# Patient Record
Sex: Female | Born: 1975 | Race: White | Hispanic: No | Marital: Married | Smoking: Current every day smoker
Health system: Southern US, Community
[De-identification: ages and names within clinical notes are randomized; demographics above are authoritative.]

## PROBLEM LIST (undated history)

## (undated) DIAGNOSIS — M797 Fibromyalgia: Secondary | ICD-10-CM

## (undated) DIAGNOSIS — E079 Disorder of thyroid, unspecified: Secondary | ICD-10-CM

## (undated) DIAGNOSIS — F319 Bipolar disorder, unspecified: Secondary | ICD-10-CM

## (undated) DIAGNOSIS — C801 Malignant (primary) neoplasm, unspecified: Secondary | ICD-10-CM

## (undated) HISTORY — PX: ABDOMINAL HYSTERECTOMY: SHX81

## (undated) HISTORY — DX: Malignant (primary) neoplasm, unspecified: C80.1

## (undated) HISTORY — DX: Disorder of thyroid, unspecified: E07.9

## (undated) HISTORY — DX: Fibromyalgia: M79.7

## (undated) HISTORY — DX: Bipolar disorder, unspecified: F31.9

## (undated) HISTORY — PX: BLADDER SURGERY: SHX569

---

## 2014-07-07 ENCOUNTER — Encounter (HOSPITAL_COMMUNITY)
Admission: EM | Disposition: A | Discharge status: Home / Self Care | Payer: Self-pay | Source: Home / Self Care | Attending: Emergency Medicine

## 2014-07-07 ENCOUNTER — Ambulatory Visit (HOSPITAL_COMMUNITY)
Admission: EM | Admit: 2014-07-07 | Discharge: 2014-07-07 | Disposition: A | Discharge status: Home / Self Care | Payer: Self-pay | Attending: Emergency Medicine | Admitting: Emergency Medicine

## 2014-07-07 ENCOUNTER — Emergency Department (HOSPITAL_COMMUNITY): Payer: Self-pay

## 2014-07-07 ENCOUNTER — Emergency Department (HOSPITAL_COMMUNITY): Payer: Medicaid - Out of State | Admitting: Anesthesiology

## 2014-07-07 ENCOUNTER — Encounter (HOSPITAL_COMMUNITY): Payer: Self-pay | Admitting: Emergency Medicine

## 2014-07-07 ENCOUNTER — Emergency Department (HOSPITAL_COMMUNITY): Payer: Self-pay | Admitting: Anesthesiology

## 2014-07-07 DIAGNOSIS — Z79899 Other long term (current) drug therapy: Secondary | ICD-10-CM | POA: Insufficient documentation

## 2014-07-07 DIAGNOSIS — S52572A Other intraarticular fracture of lower end of left radius, initial encounter for closed fracture: Secondary | ICD-10-CM | POA: Insufficient documentation

## 2014-07-07 DIAGNOSIS — Y999 Unspecified external cause status: Secondary | ICD-10-CM | POA: Insufficient documentation

## 2014-07-07 DIAGNOSIS — S62102A Fracture of unspecified carpal bone, left wrist, initial encounter for closed fracture: Secondary | ICD-10-CM

## 2014-07-07 DIAGNOSIS — W109XXA Fall (on) (from) unspecified stairs and steps, initial encounter: Secondary | ICD-10-CM | POA: Insufficient documentation

## 2014-07-07 DIAGNOSIS — Y929 Unspecified place or not applicable: Secondary | ICD-10-CM | POA: Insufficient documentation

## 2014-07-07 DIAGNOSIS — Y939 Activity, unspecified: Secondary | ICD-10-CM | POA: Insufficient documentation

## 2014-07-07 DIAGNOSIS — F17200 Nicotine dependence, unspecified, uncomplicated: Secondary | ICD-10-CM | POA: Insufficient documentation

## 2014-07-07 HISTORY — PX: OPEN REDUCTION INTERNAL FIXATION (ORIF) DISTAL RADIAL FRACTURE: SHX5989

## 2014-07-07 LAB — CBC WITH DIFFERENTIAL/PLATELET
BASOS ABS: 0 10*3/uL (ref 0.0–0.1)
BASOS PCT: 0 % (ref 0–1)
EOS ABS: 0.2 10*3/uL (ref 0.0–0.7)
EOS PCT: 2 % (ref 0–5)
HCT: 36.2 % (ref 36.0–46.0)
Hemoglobin: 11.6 g/dL — ABNORMAL LOW (ref 12.0–15.0)
LYMPHS PCT: 29 % (ref 12–46)
Lymphs Abs: 2.4 10*3/uL (ref 0.7–4.0)
MCH: 30.2 pg (ref 26.0–34.0)
MCHC: 32 g/dL (ref 30.0–36.0)
MCV: 94.3 fL (ref 78.0–100.0)
MONO ABS: 0.6 10*3/uL (ref 0.1–1.0)
Monocytes Relative: 7 % (ref 3–12)
NEUTROS PCT: 62 % (ref 43–77)
Neutro Abs: 5.1 10*3/uL (ref 1.7–7.7)
Platelets: 243 10*3/uL (ref 150–400)
RBC: 3.84 MIL/uL — AB (ref 3.87–5.11)
RDW: 12.7 % (ref 11.5–15.5)
WBC: 8.3 10*3/uL (ref 4.0–10.5)

## 2014-07-07 LAB — BASIC METABOLIC PANEL
ANION GAP: 5 (ref 5–15)
BUN: 9 mg/dL (ref 6–23)
CO2: 26 mmol/L (ref 19–32)
CREATININE: 0.64 mg/dL (ref 0.50–1.10)
Calcium: 9.1 mg/dL (ref 8.4–10.5)
Chloride: 106 mmol/L (ref 96–112)
GFR calc Af Amer: >90 mL/min (ref >90)
GFR calc non Af Amer: >90 mL/min (ref >90)
Glucose, Bld: 75 mg/dL (ref 70–99)
POTASSIUM: 4.4 mmol/L (ref 3.5–5.1)
Sodium: 137 mmol/L (ref 135–145)

## 2014-07-07 SURGERY — OPEN REDUCTION INTERNAL FIXATION (ORIF) DISTAL RADIUS FRACTURE
Anesthesia: General | Site: Wrist | Laterality: Left

## 2014-07-07 MED ORDER — DEXAMETHASONE SODIUM PHOSPHATE 4 MG/ML IJ SOLN
INTRAMUSCULAR | Status: AC
  Filled 2014-07-07: qty 1

## 2014-07-07 MED ORDER — OXYCODONE-ACETAMINOPHEN 5-325 MG PO TABS: 1.0000 | ORAL_TABLET | ORAL | Status: AC | PRN

## 2014-07-07 MED ORDER — SODIUM BICARBONATE 8.4 % IV SOLN
INTRAVENOUS | Status: AC
  Filled 2014-07-07: qty 50

## 2014-07-07 MED ORDER — OXYCODONE-ACETAMINOPHEN 5-325 MG PO TABS
ORAL_TABLET | ORAL | Status: AC
  Filled 2014-07-07: qty 1

## 2014-07-07 MED ORDER — PHENYLEPHRINE HCL 10 MG/ML IJ SOLN
INTRAMUSCULAR | Status: DC | PRN
  Administered 2014-07-07: 40 ug via INTRAVENOUS

## 2014-07-07 MED ORDER — BUPIVACAINE HCL (PF) 0.25 % IJ SOLN
INTRAMUSCULAR | Status: AC
  Filled 2014-07-07: qty 30

## 2014-07-07 MED ORDER — PROPOFOL 10 MG/ML IV BOLUS
INTRAVENOUS | Status: DC | PRN
  Administered 2014-07-07: 160 mg via INTRAVENOUS

## 2014-07-07 MED ORDER — ONDANSETRON HCL 4 MG/2ML IJ SOLN
4.0000 mg | Freq: Once | INTRAMUSCULAR | Status: AC
  Administered 2014-07-07: 4 mg via INTRAVENOUS
  Filled 2014-07-07: qty 2

## 2014-07-07 MED ORDER — MIDAZOLAM HCL 2 MG/2ML IJ SOLN
INTRAMUSCULAR | Status: AC
  Administered 2014-07-07: 2 mg via INTRAVENOUS
  Filled 2014-07-07: qty 2

## 2014-07-07 MED ORDER — OXYCODONE HCL 5 MG PO TABS: 5.0000 mg | ORAL_TABLET | Freq: Once | ORAL | Status: DC | PRN

## 2014-07-07 MED ORDER — HYDROMORPHONE HCL 1 MG/ML IJ SOLN: 0.2500 mg | INTRAMUSCULAR | Status: DC | PRN

## 2014-07-07 MED ORDER — FENTANYL CITRATE 0.05 MG/ML IJ SOLN
50.0000 ug | Freq: Once | INTRAMUSCULAR | Status: AC
  Administered 2014-07-07: 50 ug via INTRAVENOUS
  Filled 2014-07-07: qty 2

## 2014-07-07 MED ORDER — FENTANYL CITRATE 0.05 MG/ML IJ SOLN
INTRAMUSCULAR | Status: AC
  Filled 2014-07-07: qty 5

## 2014-07-07 MED ORDER — ROCURONIUM BROMIDE 50 MG/5ML IV SOLN
INTRAVENOUS | Status: AC
  Filled 2014-07-07: qty 1

## 2014-07-07 MED ORDER — SODIUM CHLORIDE 0.9 % IJ SOLN
INTRAMUSCULAR | Status: AC
  Filled 2014-07-07: qty 10

## 2014-07-07 MED ORDER — STERILE WATER FOR INJECTION IJ SOLN
INTRAMUSCULAR | Status: AC
  Filled 2014-07-07: qty 10

## 2014-07-07 MED ORDER — OXYCODONE HCL 5 MG/5ML PO SOLN: 5.0000 mg | Freq: Once | ORAL | Status: DC | PRN

## 2014-07-07 MED ORDER — PROPOFOL 10 MG/ML IV BOLUS
INTRAVENOUS | Status: AC
  Filled 2014-07-07: qty 20

## 2014-07-07 MED ORDER — SUCCINYLCHOLINE CHLORIDE 20 MG/ML IJ SOLN
INTRAMUSCULAR | Status: AC
  Filled 2014-07-07: qty 1

## 2014-07-07 MED ORDER — LIDOCAINE HCL (CARDIAC) 20 MG/ML IV SOLN
INTRAVENOUS | Status: AC
Start: 2014-07-07 — End: 2014-07-07
  Filled 2014-07-07: qty 5

## 2014-07-07 MED ORDER — PROMETHAZINE HCL 25 MG/ML IJ SOLN: 6.2500 mg | INTRAMUSCULAR | Status: DC | PRN

## 2014-07-07 MED ORDER — LACTATED RINGERS IV SOLN
INTRAVENOUS | Status: DC | PRN
  Administered 2014-07-07: 17:00:00 via INTRAVENOUS

## 2014-07-07 MED ORDER — FENTANYL CITRATE 0.05 MG/ML IJ SOLN
INTRAMUSCULAR | Status: DC | PRN
  Administered 2014-07-07 (×2): 50 ug via INTRAVENOUS

## 2014-07-07 MED ORDER — LIDOCAINE HCL (CARDIAC) 20 MG/ML IV SOLN
INTRAVENOUS | Status: AC
  Filled 2014-07-07: qty 5

## 2014-07-07 MED ORDER — OXYCODONE-ACETAMINOPHEN 5-325 MG PO TABS
1.0000 | ORAL_TABLET | Freq: Once | ORAL | Status: AC
  Administered 2014-07-07: 1 via ORAL

## 2014-07-07 MED ORDER — ONDANSETRON HCL 4 MG/2ML IJ SOLN
INTRAMUSCULAR | Status: DC | PRN
  Administered 2014-07-07: 4 mg via INTRAVENOUS

## 2014-07-07 MED ORDER — LACTATED RINGERS IV SOLN
INTRAVENOUS | Status: DC
  Administered 2014-07-07: 15:00:00 via INTRAVENOUS

## 2014-07-07 MED ORDER — ARTIFICIAL TEARS OP OINT
TOPICAL_OINTMENT | OPHTHALMIC | Status: AC
  Filled 2014-07-07: qty 3.5

## 2014-07-07 MED ORDER — MIDAZOLAM HCL 2 MG/2ML IJ SOLN
INTRAMUSCULAR | Status: AC
  Filled 2014-07-07: qty 2

## 2014-07-07 MED ORDER — CEFAZOLIN SODIUM-DEXTROSE 2-3 GM-% IV SOLR
INTRAVENOUS | Status: DC | PRN
  Administered 2014-07-07: 2 g via INTRAVENOUS

## 2014-07-07 MED ORDER — LIDOCAINE HCL (CARDIAC) 20 MG/ML IV SOLN
INTRAVENOUS | Status: DC | PRN
  Administered 2014-07-07: 80 mg via INTRAVENOUS

## 2014-07-07 MED ORDER — FENTANYL CITRATE 0.05 MG/ML IJ SOLN
INTRAMUSCULAR | Status: AC
  Filled 2014-07-07: qty 2

## 2014-07-07 MED ORDER — FENTANYL CITRATE 0.05 MG/ML IJ SOLN
100.0000 ug | Freq: Once | INTRAMUSCULAR | Status: AC
Start: 2014-07-07 — End: 2014-07-07
  Administered 2014-07-07: 100 ug via INTRAVENOUS

## 2014-07-07 MED ORDER — FENTANYL CITRATE 0.05 MG/ML IJ SOLN: 50.0000 ug | INTRAMUSCULAR | Status: DC | PRN

## 2014-07-07 MED ORDER — EPHEDRINE SULFATE 50 MG/ML IJ SOLN
INTRAMUSCULAR | Status: AC
  Filled 2014-07-07: qty 1

## 2014-07-07 MED ORDER — ONDANSETRON HCL 4 MG/2ML IJ SOLN
INTRAMUSCULAR | Status: AC
  Filled 2014-07-07: qty 2

## 2014-07-07 MED ORDER — MIDAZOLAM HCL 2 MG/2ML IJ SOLN
2.0000 mg | Freq: Once | INTRAMUSCULAR | Status: AC
  Administered 2014-07-07: 2 mg via INTRAVENOUS

## 2014-07-07 MED ORDER — BUPIVACAINE-EPINEPHRINE (PF) 0.5% -1:200000 IJ SOLN
INTRAMUSCULAR | Status: DC | PRN
  Administered 2014-07-07: 20 mL via PERINEURAL

## 2014-07-07 MED ORDER — DEXAMETHASONE SODIUM PHOSPHATE 4 MG/ML IJ SOLN
INTRAMUSCULAR | Status: DC | PRN
  Administered 2014-07-07: 4 mg via INTRAVENOUS

## 2014-07-07 MED ORDER — CEFAZOLIN SODIUM-DEXTROSE 2-3 GM-% IV SOLR
INTRAVENOUS | Status: AC
  Filled 2014-07-07: qty 50

## 2014-07-07 SURGICAL SUPPLY — 63 items
BANDAGE ELASTIC 3 VELCRO ST LF (GAUZE/BANDAGES/DRESSINGS) ×3 IMPLANT
BANDAGE ELASTIC 4 VELCRO ST LF (GAUZE/BANDAGES/DRESSINGS) ×3 IMPLANT
BIT DRILL 2 FAST STEP (BIT) ×3 IMPLANT
BIT DRILL 2.5X4 QC (BIT) ×3 IMPLANT
BNDG CMPR 9X4 STRL LF SNTH (GAUZE/BANDAGES/DRESSINGS) ×1
BNDG ESMARK 4X9 LF (GAUZE/BANDAGES/DRESSINGS) ×3 IMPLANT
BNDG GAUZE ELAST 4 BULKY (GAUZE/BANDAGES/DRESSINGS) ×3 IMPLANT
CLOSURE WOUND 1/2 X4 (GAUZE/BANDAGES/DRESSINGS) ×1
CORDS BIPOLAR (ELECTRODE) IMPLANT
COVER SURGICAL LIGHT HANDLE (MISCELLANEOUS) ×3 IMPLANT
CUFF TOURNIQUET SINGLE 18IN (TOURNIQUET CUFF) ×3 IMPLANT
CUFF TOURNIQUET SINGLE 24IN (TOURNIQUET CUFF) IMPLANT
DRAPE C-ARM MINI 42X72 WSTRAPS (DRAPES) ×3 IMPLANT
DRAPE OEC MINIVIEW 54X84 (DRAPES) IMPLANT
DRAPE SURG 17X23 STRL (DRAPES) ×3 IMPLANT
DURAPREP 26ML APPLICATOR (WOUND CARE) ×3 IMPLANT
ELECT REM PT RETURN 9FT ADLT (ELECTROSURGICAL)
ELECTRODE REM PT RTRN 9FT ADLT (ELECTROSURGICAL) IMPLANT
GAUZE SPONGE 4X4 12PLY STRL (GAUZE/BANDAGES/DRESSINGS) ×3 IMPLANT
GAUZE XEROFORM 1X8 LF (GAUZE/BANDAGES/DRESSINGS) ×3 IMPLANT
GLOVE SURG SYN 8.0 (GLOVE) ×3 IMPLANT
GOWN STRL REUS W/ TWL LRG LVL3 (GOWN DISPOSABLE) ×1 IMPLANT
GOWN STRL REUS W/ TWL XL LVL3 (GOWN DISPOSABLE) ×1 IMPLANT
GOWN STRL REUS W/TWL LRG LVL3 (GOWN DISPOSABLE) ×3
GOWN STRL REUS W/TWL XL LVL3 (GOWN DISPOSABLE) ×3
KIT BASIN OR (CUSTOM PROCEDURE TRAY) ×3 IMPLANT
KIT ROOM TURNOVER OR (KITS) ×3 IMPLANT
MANIFOLD NEPTUNE II (INSTRUMENTS) ×3 IMPLANT
NEEDLE HYPO 25GX1X1/2 BEV (NEEDLE) IMPLANT
NS IRRIG 1000ML POUR BTL (IV SOLUTION) ×3 IMPLANT
PACK ORTHO EXTREMITY (CUSTOM PROCEDURE TRAY) ×3 IMPLANT
PAD ARMBOARD 7.5X6 YLW CONV (MISCELLANEOUS) ×6 IMPLANT
PAD CAST 3X4 CTTN HI CHSV (CAST SUPPLIES) ×1 IMPLANT
PAD CAST 4YDX4 CTTN HI CHSV (CAST SUPPLIES) ×1 IMPLANT
PADDING CAST ABS 4INX4YD NS (CAST SUPPLIES) ×2
PADDING CAST ABS COTTON 4X4 ST (CAST SUPPLIES) ×1 IMPLANT
PADDING CAST COTTON 3X4 STRL (CAST SUPPLIES) ×3
PADDING CAST COTTON 4X4 STRL (CAST SUPPLIES) ×3
PEG SUBCHONDRAL SMOOTH 2.0X20 (Peg) ×12 IMPLANT
PEG SUBCHONDRAL SMOOTH 2.0X22 (Peg) ×9 IMPLANT
PENCIL BUTTON HOLSTER BLD 10FT (ELECTRODE) IMPLANT
PLATE STAN 24.4X59.5 LT (Plate) ×3 IMPLANT
SCREW BN 12X3.5XNS CORT TI (Screw) ×1 IMPLANT
SCREW BN 13X3.5XNS CORT TI (Screw) ×1 IMPLANT
SCREW CORT 3.5X10 LNG (Screw) ×3 IMPLANT
SCREW CORT 3.5X12 (Screw) ×3 IMPLANT
SCREW CORT 3.5X13 (Screw) ×3 IMPLANT
SPLINT PLASTER EXTRA FAST 3X15 (CAST SUPPLIES) ×2
SPLINT PLASTER GYPS XFAST 3X15 (CAST SUPPLIES) ×1 IMPLANT
SPONGE LAP 4X18 X RAY DECT (DISPOSABLE) IMPLANT
SPONGE SCRUB IODOPHOR (GAUZE/BANDAGES/DRESSINGS) ×3 IMPLANT
STRIP CLOSURE SKIN 1/2X4 (GAUZE/BANDAGES/DRESSINGS) ×2 IMPLANT
SUT PROLENE 3 0 PS 2 (SUTURE) ×3 IMPLANT
SUT VIC AB 2-0 FS1 27 (SUTURE) ×3 IMPLANT
SUT VIC AB 3-0 FS2 27 (SUTURE) IMPLANT
SUT VICRYL 4-0 PS2 18IN ABS (SUTURE) IMPLANT
SYR CONTROL 10ML LL (SYRINGE) IMPLANT
TOWEL OR 17X24 6PK STRL BLUE (TOWEL DISPOSABLE) ×3 IMPLANT
TOWEL OR 17X26 10 PK STRL BLUE (TOWEL DISPOSABLE) ×3 IMPLANT
TUBE CONNECTING 12'X1/4 (SUCTIONS)
TUBE CONNECTING 12X1/4 (SUCTIONS) IMPLANT
UNDERPAD 30X30 INCONTINENT (UNDERPADS AND DIAPERS) ×3 IMPLANT
WATER STERILE IRR 1000ML POUR (IV SOLUTION) ×3 IMPLANT

## 2014-07-07 NOTE — Transfer of Care (Signed)
Immediate Anesthesia Transfer of Care Note  Patient: Jennifer Michael  Procedure(s) Performed: Procedure(s): OPEN REDUCTION INTERNAL FIXATION (ORIF) DISTAL RADIAL FRACTURE (Left)  Patient Location: PACU  Anesthesia Type:GA combined with regional for post-op pain  Level of Consciousness: awake  Airway & Oxygen Therapy: Patient Spontanous Breathing  Post-op Assessment: Report given to RN and Post -op Vital signs reviewed and stable  Post vital signs: Reviewed and stable  Last Vitals:  Filed Vitals:   07/07/14 1317  BP: 108/67  Pulse: 73  Temp: 36.7 C  Resp: 18    Complications: No apparent anesthesia complications

## 2014-07-07 NOTE — ED Notes (Signed)
Bed: WA20 Expected date:  Expected time:  Means of arrival:  Comments: 

## 2014-07-07 NOTE — H&P (Signed)
Jennifer Michael is an 39 y.o. female.   Chief Complaint: left wrist pain and deformity HPI: as above s/p [place] with left distal radius fracture  History reviewed. No pertinent past medical history.  Past Surgical History  Procedure Laterality Date  . Bladder surgery      History reviewed. No pertinent family history. Social History:  reports that she has been smoking.  She does not have any smokeless tobacco history on file. She reports that she does not drink alcohol or use illicit drugs.  Allergies: No Known Allergies  Medications Prior to Admission  Medication Sig Dispense Refill  . venlafaxine (EFFEXOR) 50 MG tablet Take 50 mg by mouth at bedtime.      Results for orders placed or performed during the hospital encounter of 07/07/14 (from the past 48 hour(s))  CBC with Differential     Status: Abnormal   Collection Time: 07/07/14 12:23 PM  Result Value Ref Range   WBC 8.3 4.0 - 10.5 K/uL   RBC 3.84 (L) 3.87 - 5.11 MIL/uL   Hemoglobin 11.6 (L) 12.0 - 15.0 g/dL   HCT 36.2 36.0 - 46.0 %   MCV 94.3 78.0 - 100.0 fL   MCH 30.2 26.0 - 34.0 pg   MCHC 32.0 30.0 - 36.0 g/dL   RDW 12.7 11.5 - 15.5 %   Platelets 243 150 - 400 K/uL   Neutrophils Relative % 62 43 - 77 %   Neutro Abs 5.1 1.7 - 7.7 K/uL   Lymphocytes Relative 29 12 - 46 %   Lymphs Abs 2.4 0.7 - 4.0 K/uL   Monocytes Relative 7 3 - 12 %   Monocytes Absolute 0.6 0.1 - 1.0 K/uL   Eosinophils Relative 2 0 - 5 %   Eosinophils Absolute 0.2 0.0 - 0.7 K/uL   Basophils Relative 0 0 - 1 %   Basophils Absolute 0.0 0.0 - 0.1 K/uL  Basic metabolic panel     Status: None   Collection Time: 07/07/14 12:23 PM  Result Value Ref Range   Sodium 137 135 - 145 mmol/L   Potassium 4.4 3.5 - 5.1 mmol/L   Chloride 106 96 - 112 mmol/L   CO2 26 19 - 32 mmol/L   Glucose, Bld 75 70 - 99 mg/dL   BUN 9 6 - 23 mg/dL   Creatinine, Ser 0.64 0.50 - 1.10 mg/dL   Calcium 9.1 8.4 - 10.5 mg/dL   GFR calc non Af Amer >90 >90 mL/min   GFR calc Af  Amer >90 >90 mL/min    Comment: (NOTE) The eGFR has been calculated using the CKD EPI equation. This calculation has not been validated in all clinical situations. eGFR's persistently <90 mL/min signify possible Chronic Kidney Disease.    Anion gap 5 5 - 15    Comment: Performed at [facility]   Dg Wrist Complete Left  07/07/2014   CLINICAL DATA:  Acute left wrist pain after fall down steps at motel. Initial encounter.  EXAM: LEFT WRIST - COMPLETE 3+ VIEW  COMPARISON:  None.  FINDINGS: Severely and posteriorly displaced comminuted fracture of the distal left radius is noted. This appears to be closed and posttraumatic.  IMPRESSION: Severely displaced comminuted fracture of the distal left radius.   Electronically Signed   By: Marijo Conception, M.D.   On: 07/07/2014 11:38    Review of Systems  All other systems reviewed and are negative.   Blood pressure 108/67, pulse 73, temperature 98.1 F (36.7 C),  temperature source Oral, resp. rate 18, SpO2 98 %. Physical Exam  Constitutional: She is oriented to person, place, and time. She appears well-developed and well-nourished.  HENT:  Head: Normocephalic and atraumatic.  Cardiovascular: Normal rate.   Respiratory: Effort normal.  Musculoskeletal:       Left wrist: She exhibits bony tenderness, swelling and deformity.  Displaced left distal radius fracture  Neurological: She is alert and oriented to person, place, and time.  Skin: Skin is warm.  Psychiatric: She has a normal mood and affect. Her behavior is normal. Judgment and thought content normal.     Assessment/Plan As above   Plan ORIF  Urijah Arko A 07/07/2014, 3:59 PM

## 2014-07-07 NOTE — ED Provider Notes (Signed)
Complains of left wrist pain after she slipped on wet floor falling down steps 30 minute prior to coming here. No other injury. On exam patient is alert Glasgow Coma Score 15 left upper extremity skin intact radial pulse 2+. Deformity at and tenderness wrist. Good capillary refill X-rays viewed by me  Orlie Dakin, MD 07/07/14 1208

## 2014-07-07 NOTE — ED Notes (Signed)
Pt states that she is staying at the [facility] [address].  States that she was going down the stairs and did not see a wet floor sign.  Stairs had just been mopped.  States that she slipped and fell down about 8 stairs.  Landed 3 stairs from the bottom.  Obvious deformity noted to lt wrist.  Denies other injury.

## 2014-07-07 NOTE — Op Note (Signed)
See note [phone]

## 2014-07-07 NOTE — Progress Notes (Addendum)
Patient reports that she is itching all over d/t eczema and that she has a rash on both legs from scratching same. Reports that the itching is not new since being in the hospital. Request something for pain and itching Will notify Dr. Massage of same. Dr. Orene Desanctis notified orders received for pain medication

## 2014-07-07 NOTE — ED Notes (Signed)
Report called to [facility] in [facility] Short Stay. Instructions and relevant contact information will be relayed to pt. IV secured in [place].

## 2014-07-07 NOTE — Op Note (Signed)
[name], Jennifer Michael             ACCOUNT NO.:  [id number]  MEDICAL RECORD NO.:  [id number]  LOCATION:  [facility]                         FACILITY:  [facility]  PHYSICIAN:  Sheral Apley. Adarrius Graeff, M.D.DATE OF BIRTH:  Dec 26, 1975  DATE OF PROCEDURE:  07/07/2014 DATE OF DISCHARGE:  07/07/2014                              OPERATIVE REPORT   PREOPERATIVE DIAGNOSIS:  Displaced intra-articular fracture distal radius, left side.  POSTOPERATIVE DIAGNOSIS:  Displaced intra-articular fracture distal radius, left side.  PROCEDURE:  ORIF of above with DVR plate and screws, brachioradialis release.  SURGEON:  Sheral Apley. Burney Gauze, M.D.  ASSISTANT:  None.  ANESTHESIA:  General with supraclavicular block.  COMPLICATION:  None.  DRAINS:  None.  OPERATIVE DESCRIPTION:  The patient was taken operating suite.  After induction of adequate supraclavicular block analgesia and general laryngeal mask airway general anesthetic, left upper extremity was prepped and draped in sterile fashion.  An Esmarch was used to exsanguinate the limb.  Tourniquet was inflated to 250 mmHg.  At this point in time, an incision was made over the palpable border of flexor carpi radialis tendon along the volar aspect of the left distal radius area.  Skin was incised sharply.  The sheath overlying the FCR was incised.  The FCR was tracked to the midline, radial artery to lateral side.  Fascia was incised and developed down to the level of pronator quadratus.  We subperiosteally stripped the pronator quadratus, this showed displaced three-part intra-articular fracture distal radius, left side.  We released brachioradialis off the radial styloid fragment. Reduction was then performed with flexion, ulnar deviation, and traction.  We placed a standard DVR plate on the lower aspect of distal radius.  Under direct fluoroscopic guidance, determined appropriate plate position.  We put a screw in the slotted hole followed by 2 more screws  proximally followed by smooth pegs distally.  Intraoperative fluoroscopy revealed adequate reduction in AP, lateral, oblique view. The wound was thoroughly irrigated.  The pronator quadratus was repaired using 2-0 Vicryl to cover the plate almost completely, 2-0 Vicryl subcutaneously, and 3-0 Prolene subcuticular stitch on the skin. Steri-Strips, 4x4s, fluffs, and a volar splint was applied.  Patient tolerated the procedure well in concealed fashion.     Sheral Apley Burney Gauze, M.D.     MAW/MEDQ  D:  07/07/2014  T:  07/07/2014  Job:  [id number]

## 2014-07-07 NOTE — Anesthesia Preprocedure Evaluation (Addendum)
Anesthesia Evaluation  Patient identified by MRN, date of birth, ID band Patient awake    Reviewed: Allergy & Precautions, NPO status , Patient's Chart, lab work & pertinent test results  Airway Mallampati: I  TM Distance: >3 FB Neck ROM: Full    Dental  (+) Poor Dentition, Dental Advisory Given   Pulmonary Current Smoker,  breath sounds clear to auscultation        Cardiovascular Rhythm:Regular Rate:Normal     Neuro/Psych    GI/Hepatic   Endo/Other    Renal/GU      Musculoskeletal   Abdominal   Peds  Hematology   Anesthesia Other Findings   Reproductive/Obstetrics                            Anesthesia Physical Anesthesia Plan  ASA: II and emergent  Anesthesia Plan:    Post-op Pain Management:    Induction: Intravenous  Airway Management Planned: LMA  Additional Equipment:   Intra-op Plan:   Post-operative Plan: Extubation in OR  Informed Consent: I have reviewed the patients History and Physical, chart, labs and discussed the procedure including the risks, benefits and alternatives for the proposed anesthesia with the patient or authorized representative who has indicated his/her understanding and acceptance.   Dental advisory given  Plan Discussed with: CRNA, Surgeon and Anesthesiologist  Anesthesia Plan Comments:        Anesthesia Quick Evaluation

## 2014-07-07 NOTE — Anesthesia Procedure Notes (Addendum)
Anesthesia Regional Block:  Supraclavicular block  Pre-Anesthetic Checklist: ,, timeout performed, Correct Patient, Correct Site, Correct Laterality, Correct Procedure, Correct Position, site marked, Risks and benefits discussed,  Surgical consent,  Pre-op evaluation,  At surgeon's request and post-op pain management  Laterality: Upper  Prep: chloraprep       Needles:   Needle Type: Echogenic Needle     Needle Length: 9cm 9 cm Needle Gauge: 21 and 21 G  Needle insertion depth: 4 cm   Additional Needles:  Procedures: ultrasound guided (picture in chart) and nerve stimulator Supraclavicular block Narrative:  Start time: 07/07/2014 3:55 PM End time: 07/07/2014 4:10 PM Injection made incrementally with aspirations every 5 mL.  Performed by: Personally  Anesthesiologist: MASSAGEE, TERRY  Additional Notes: Tolerated well   Procedure Name: LMA Insertion Date/Time: 07/07/2014 4:43 PM Performed by: Maude Leriche D Pre-anesthesia Checklist: Patient identified, Emergency Drugs available, Suction available, Patient being monitored and Timeout performed Patient Re-evaluated:Patient Re-evaluated prior to inductionOxygen Delivery Method: Circle system utilized Preoxygenation: Pre-oxygenation with 100% oxygen Intubation Type: IV induction Ventilation: Mask ventilation without difficulty LMA: LMA inserted LMA Size: 4.0 Number of attempts: 1 Placement Confirmation: positive ETCO2 and breath sounds checked- equal and bilateral Tube secured with: Tape Dental Injury: Teeth and Oropharynx as per pre-operative assessment

## 2014-07-07 NOTE — ED Provider Notes (Signed)
CSN: [id number]     Arrival date & time 07/07/14  1039 History   First MD Initiated Contact with Patient 07/07/14 1100     Chief Complaint  Patient presents with  . Fall  . Wrist Injury     (Consider location/radiation/quality/duration/timing/severity/associated sxs/prior Treatment) HPI Jennifer Michael is Jennifer 39 y.o. female presents to ED with complaint of left wrist injury. Pt states she slipped on wet floor and fell down onto left hand. States she then slipped onto her bottom and slipped down Jennifer few steps. She states she was able to get up and ambulate. There is no other injuries. She denies hitting her head or having loss of consciousness. No treatment prior to coming in. Deformity to the left wrist with pain. No numbness or weakness distally. No prior orthopedic injuries.  History reviewed. No pertinent past medical history. Past Surgical History  Procedure Laterality Date  . Bladder surgery     History reviewed. No pertinent family history. History  Substance Use Topics  . Smoking status: Current Every Day Smoker  . Smokeless tobacco: Not on file  . Alcohol Use: No   OB History    No data available     Review of Systems  Musculoskeletal: Positive for joint swelling and arthralgias. Negative for back pain and neck pain.  Neurological: Negative for weakness, numbness and headaches.  All other systems reviewed and are negative.     Allergies  Review of patient's allergies indicates no known allergies.  Home Medications   Prior to Admission medications   Not on File   BP 106/72 mmHg  Pulse 96  Temp(Src) 98.9 F (37.2 C) (Oral)  Resp 18  SpO2 100% Physical Exam  Constitutional: She appears well-developed and well-nourished. No distress.  HENT:  Head: Normocephalic.  Eyes: Conjunctivae are normal.  Neck: Neck supple.  Cardiovascular: Normal rate, regular rhythm and normal heart sounds.   Pulmonary/Chest: Effort normal and breath sounds normal. No respiratory  distress. She has no wheezes. She has no rales.  Abdominal: Soft. Bowel sounds are normal. She exhibits no distension. There is no tenderness. There is no rebound.  Musculoskeletal: She exhibits no edema.  Swelling and deformity of the left wrist. Tender to palpation diffusely over the wrist joint. Unable to move at the wrist joint. Unable to extend the thumb. Patient is able to wiggle all the rest of her fingers. Cap refill less than 2 seconds distally in all fingers. Sensation intact to her palmar and dorsal surface of the hand. Hand is warm and pink. Distal radial pulse intact.  Neurological: She is alert.  Skin: Skin is warm and dry.  Psychiatric: She has Jennifer normal mood and affect. Her behavior is normal.  Nursing note and vitals reviewed.   ED Course  Procedures (including critical care time) Labs Review Labs Reviewed - No data to display  Imaging Review No results found.   EKG Interpretation None      MDM   Final diagnoses:  Left wrist fracture, closed, initial encounter   Pt with comminuted and displaced fracture of left wrist. Discussed with Dr. Burney Gauze, will tramsfer to [facility] for surgical repair. Labs already collected by RN. Pain controlled with fentanyl. Sugar tong splint applied prior to transfer. Pt is neurovascularly intact. Agrees to the plan.   Filed Vitals:   07/07/14 1052  BP: 106/72  Pulse: 96  Temp: 98.9 F (37.2 C)  TempSrc: Oral  Resp: 18  SpO2: 100%       Jennifer Michael  Jennifer Aidah Forquer, PA-C 07/08/14 [place], MD 07/09/14 1026

## 2014-07-09 ENCOUNTER — Encounter (HOSPITAL_COMMUNITY): Payer: Self-pay | Admitting: Orthopedic Surgery

## 2014-07-10 NOTE — Anesthesia Postprocedure Evaluation (Signed)
  Anesthesia Post-op Note  Patient: Jennifer Michael  Procedure(s) Performed: Procedure(s): OPEN REDUCTION INTERNAL FIXATION (ORIF) DISTAL RADIAL FRACTURE (Left)  Patient Location: PACU  Anesthesia Type:General  Level of Consciousness: awake and alert   Airway and Oxygen Therapy: Patient Spontanous Breathing  Post-op Pain: mild  Post-op Assessment: Post-op Vital signs reviewed  Post-op Vital Signs: stable  Last Vitals:  Filed Vitals:   07/07/14 1858  BP:   Pulse:   Temp: 36.4 C  Resp:     Complications: No apparent anesthesia complications

## 2016-03-25 IMAGING — CR DG WRIST COMPLETE 3+V*L*
4 series · 4 of 4 positions shown · non-contrast
Comparison: None.

CLINICAL DATA: Acute left wrist pain after fall down steps at
motel. Initial encounter.

EXAM:
LEFT WRIST - COMPLETE 3+ VIEW

[x wrist obl left]
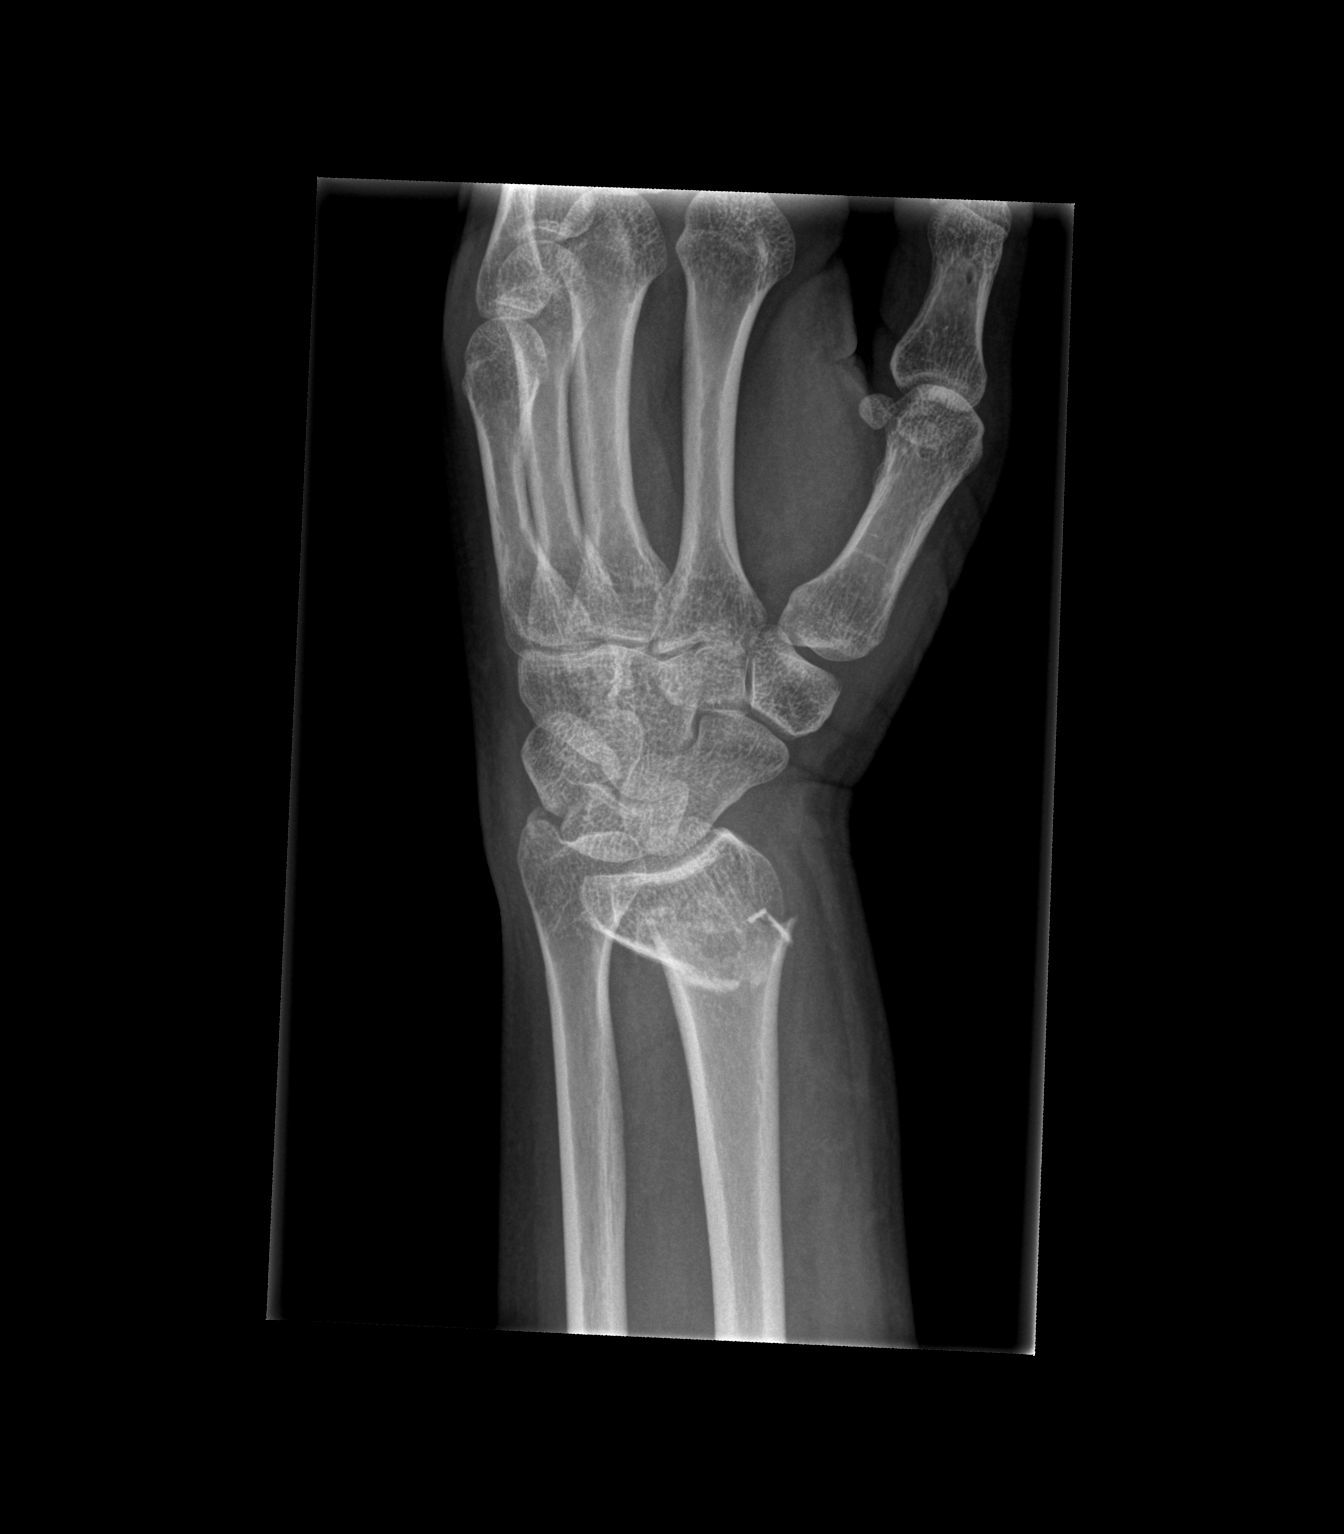

[x wrist lat left]
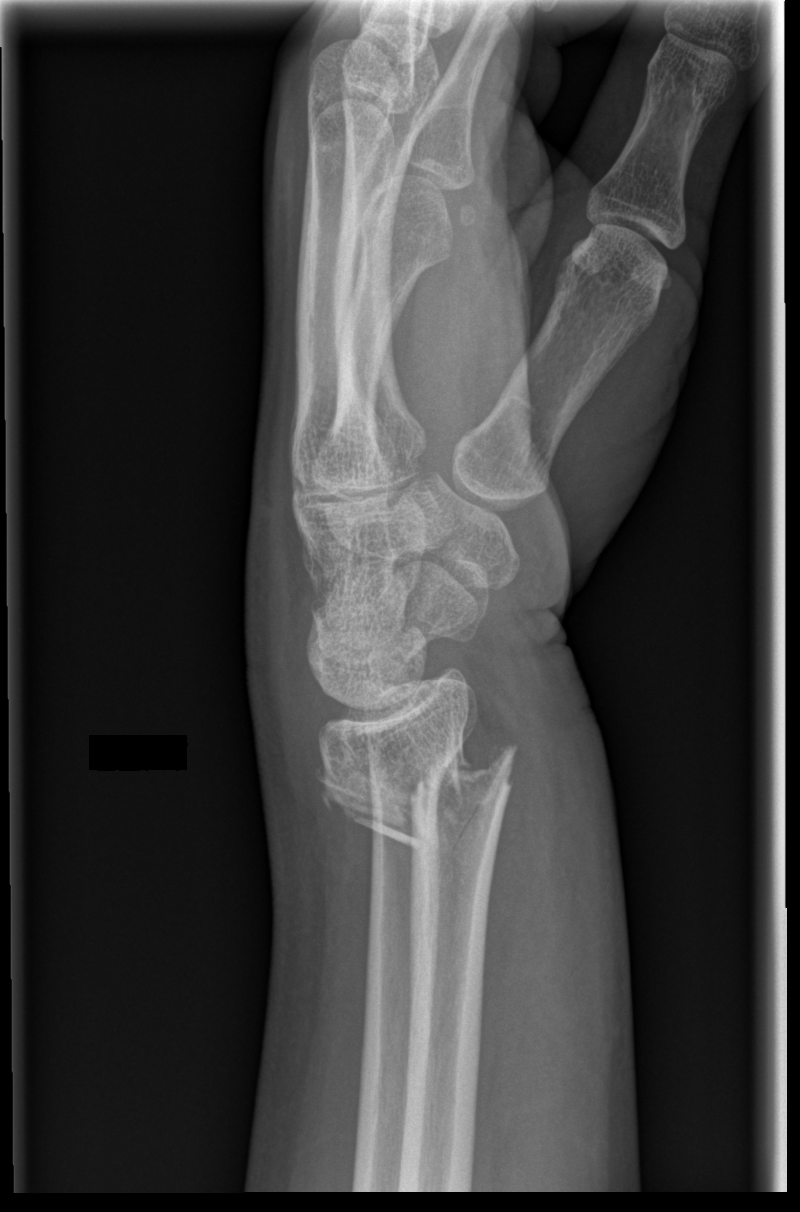

[x wrist pa left]
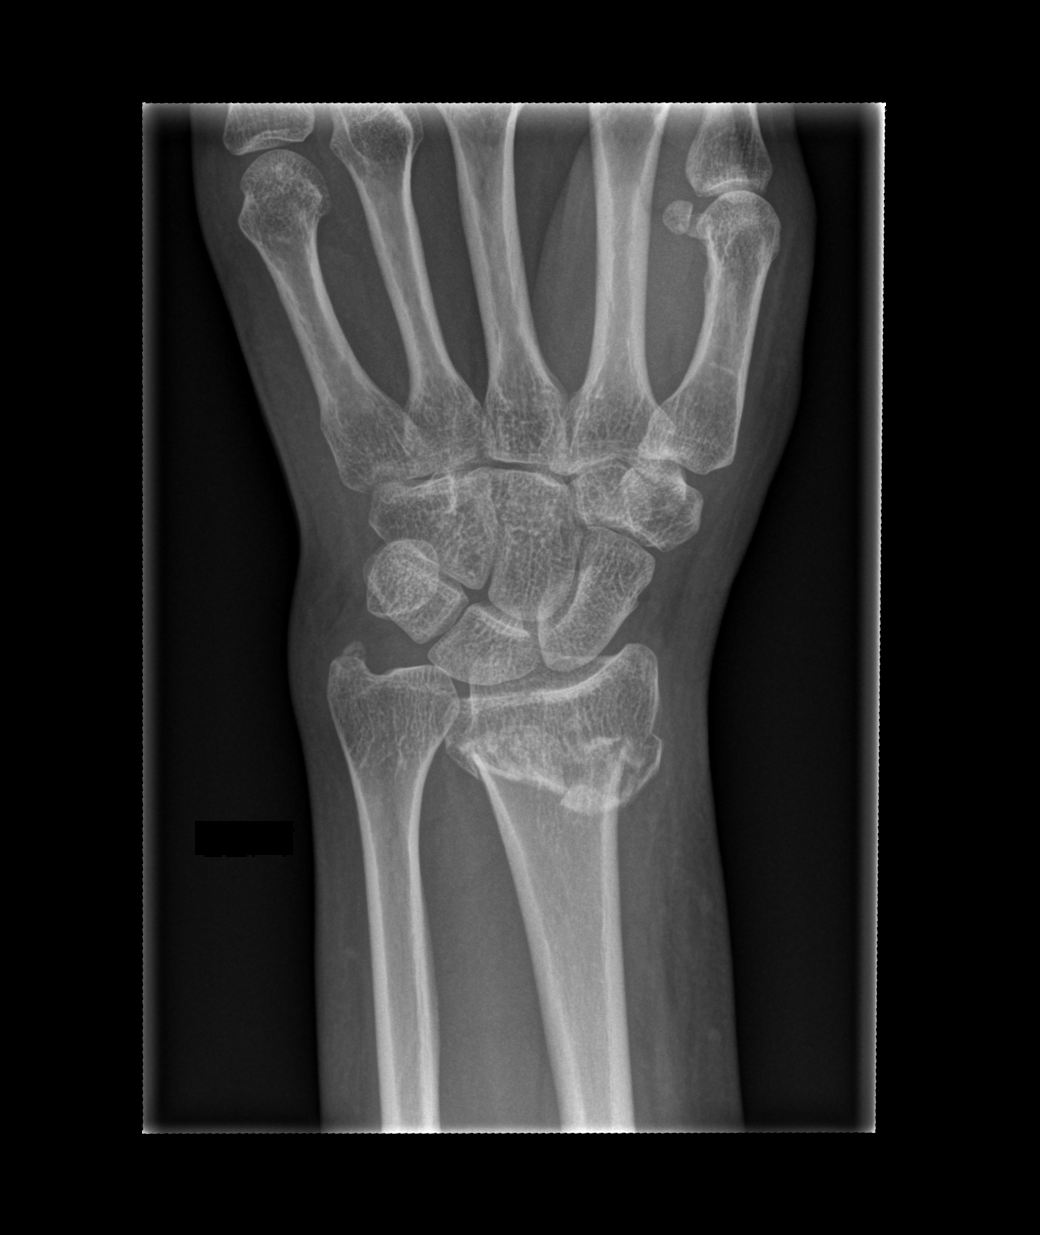

[x wrist navicular view left]
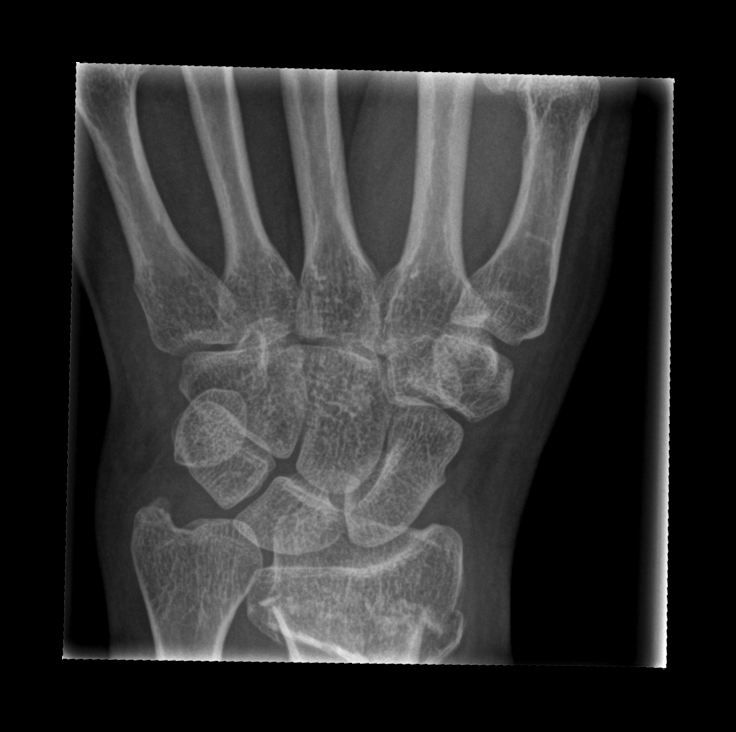

[4 of 4 positions shown; findings below may reference images not displayed]

FINDINGS: Severely and posteriorly displaced comminuted fracture of the distal
left radius is noted. This appears to be closed and posttraumatic.
IMPRESSION: Severely displaced comminuted fracture of the distal left radius.

## 2020-10-16 ENCOUNTER — Telehealth (HOSPITAL_BASED_OUTPATIENT_CLINIC_OR_DEPARTMENT_OTHER): Payer: Self-pay | Admitting: Emergency Medicine

## 2020-10-16 ENCOUNTER — Encounter (HOSPITAL_BASED_OUTPATIENT_CLINIC_OR_DEPARTMENT_OTHER): Payer: Self-pay

## 2020-10-16 ENCOUNTER — Emergency Department (HOSPITAL_BASED_OUTPATIENT_CLINIC_OR_DEPARTMENT_OTHER)
Admission: EM | Admit: 2020-10-16 | Discharge: 2020-10-16 | Disposition: A | Discharge status: Home / Self Care | Payer: Medicaid Other | Attending: Emergency Medicine | Admitting: Emergency Medicine

## 2020-10-16 ENCOUNTER — Other Ambulatory Visit: Payer: Self-pay

## 2020-10-16 DIAGNOSIS — U071 COVID-19: Secondary | ICD-10-CM | POA: Insufficient documentation

## 2020-10-16 DIAGNOSIS — Z859 Personal history of malignant neoplasm, unspecified: Secondary | ICD-10-CM | POA: Diagnosis not present

## 2020-10-16 DIAGNOSIS — R509 Fever, unspecified: Secondary | ICD-10-CM | POA: Diagnosis present

## 2020-10-16 DIAGNOSIS — J069 Acute upper respiratory infection, unspecified: Secondary | ICD-10-CM | POA: Insufficient documentation

## 2020-10-16 DIAGNOSIS — N3000 Acute cystitis without hematuria: Secondary | ICD-10-CM | POA: Insufficient documentation

## 2020-10-16 DIAGNOSIS — F17210 Nicotine dependence, cigarettes, uncomplicated: Secondary | ICD-10-CM | POA: Insufficient documentation

## 2020-10-16 LAB — URINALYSIS, ROUTINE W REFLEX MICROSCOPIC
Bilirubin Urine: NEGATIVE
Glucose, UA: NEGATIVE mg/dL
Ketones, ur: NEGATIVE mg/dL
Leukocytes,Ua: NEGATIVE
Nitrite: POSITIVE — AB
Protein, ur: 100 mg/dL — AB
Specific Gravity, Urine: 1.025 (ref 1.005–1.030)
pH: 5.5 (ref 5.0–8.0)

## 2020-10-16 LAB — URINALYSIS, MICROSCOPIC (REFLEX)

## 2020-10-16 LAB — RESP PANEL BY RT-PCR (FLU A&B, COVID) ARPGX2
Influenza A by PCR: NEGATIVE
Influenza B by PCR: NEGATIVE
SARS Coronavirus 2 by RT PCR: POSITIVE — AB

## 2020-10-16 MED ORDER — KETOROLAC TROMETHAMINE 60 MG/2ML IM SOLN
15.0000 mg | Freq: Once | INTRAMUSCULAR | Status: AC
  Administered 2020-10-16: 15 mg via INTRAMUSCULAR
  Filled 2020-10-16: qty 2

## 2020-10-16 MED ORDER — CEPHALEXIN 500 MG PO CAPS: 500.0000 mg | ORAL_CAPSULE | Freq: Four times a day (QID) | ORAL | 0 refills | Status: AC

## 2020-10-16 MED ORDER — CEPHALEXIN 250 MG PO CAPS
1000.0000 mg | ORAL_CAPSULE | Freq: Once | ORAL | Status: AC
  Administered 2020-10-16: 1000 mg via ORAL
  Filled 2020-10-16: qty 4

## 2020-10-16 MED ORDER — ONDANSETRON 4 MG PO TBDP: ORAL_TABLET | ORAL | 0 refills | Status: AC

## 2020-10-16 MED ORDER — BENZONATATE 100 MG PO CAPS: 100.0000 mg | ORAL_CAPSULE | Freq: Three times a day (TID) | ORAL | 0 refills | Status: AC

## 2020-10-16 NOTE — Discharge Instructions (Signed)
You have a lot of bacteria in your urine.  I will treat you for urinary tract infection.  Most likely have an upper respiratory illness.  This most, caused by a virus.  I sent off testing for COVID and flu.  He can check this on MyChart.  Take tylenol 2 pills 4 times a day and motrin 4 pills 3 times a day.  Drink plenty of fluids.  Return for worsening shortness of breath, headache, confusion. Follow up with your family doctor.

## 2020-10-16 NOTE — Telephone Encounter (Signed)
Pt called and informed of Positive Covid results. Consulted with Dr. Tyrone Nine who advised pt to quarantine for 5 days. Pt verbalized understanding.

## 2020-10-16 NOTE — ED Provider Notes (Signed)
[facility] EMERGENCY DEPARTMENT Provider Note   CSN: [id number] Arrival date & time: 10/16/20  1950     History Chief Complaint  Patient presents with  . Generalized Body Aches    Jennifer Michael is a 45 y.o. female.  45 yo F with a chief complaints of cough congestion headache loss of smell and taste nausea myalgias going on just since this morning.  States that she woke up and suddenly felt terrible.  States that she is really been unable to get out of bed.  Has had some dysuria as well.  Denies any flank pain.  The history is provided by the patient.  Illness Severity:  Moderate Onset quality:  Gradual Duration:  1 day Timing:  Constant Progression:  Unchanged Chronicity:  New Associated symptoms: abdominal pain, congestion, cough, fever, myalgias, nausea and shortness of breath   Associated symptoms: no chest pain, no headaches, no rhinorrhea, no vomiting and no wheezing        Past Medical History:  Diagnosis Date  . Bipolar disorder ([place])   . Cancer ([place])   . Fibromyalgia   . Thyroid disease     There are no problems to display for this patient.   Past Surgical History:  Procedure Laterality Date  . ABDOMINAL HYSTERECTOMY    . BLADDER SURGERY    . CESAREAN SECTION    . OPEN REDUCTION INTERNAL FIXATION (ORIF) DISTAL RADIAL FRACTURE Left 07/07/2014   Procedure: OPEN REDUCTION INTERNAL FIXATION (ORIF) DISTAL RADIAL FRACTURE;  Surgeon: Charlotte Crumb, MD;  Location: [facility];  Service: Orthopedics;  Laterality: Left;     OB History   No obstetric history on file.     No family history on file.  Social History   Tobacco Use  . Smoking status: Current Every Day Smoker    Types: Cigarettes  . Smokeless tobacco: Never Used  Substance Use Topics  . Alcohol use: No  . Drug use: Yes    Types: Marijuana    Home Medications Prior to Admission medications   Medication Sig Start Date End Date Taking? Authorizing Provider  benzonatate (TESSALON)  100 MG capsule Take 1 capsule (100 mg total) by mouth every 8 (eight) hours. 10/16/20  Yes Deno Etienne, DO  cephALEXin (KEFLEX) 500 MG capsule Take 1 capsule (500 mg total) by mouth 4 (four) times daily. 10/16/20  Yes Deno Etienne, DO  ondansetron (ZOFRAN ODT) 4 MG disintegrating tablet 4mg  ODT q4 hours prn nausea/vomit 10/16/20  Yes Deno Etienne, DO  oxyCODONE-acetaminophen (ROXICET) 5-325 MG per tablet Take 1 tablet by mouth every 4 (four) hours as needed for severe pain. 07/07/14   Charlotte Crumb, MD  venlafaxine (EFFEXOR) 50 MG tablet Take 50 mg by mouth at bedtime.    [provider]    Allergies    Wellbutrin [bupropion]  Review of Systems   Review of Systems  Constitutional: Positive for chills and fever.  HENT: Positive for congestion. Negative for rhinorrhea.   Eyes: Negative for redness and visual disturbance.  Respiratory: Positive for cough and shortness of breath. Negative for wheezing.   Cardiovascular: Negative for chest pain and palpitations.  Gastrointestinal: Positive for abdominal pain and nausea. Negative for vomiting.  Genitourinary: Negative for dysuria and urgency.  Musculoskeletal: Positive for myalgias. Negative for arthralgias.  Skin: Negative for pallor and wound.  Neurological: Negative for dizziness and headaches.    Physical Exam Updated Vital Signs BP 119/70 (BP Location: Left Arm)   Pulse (!) 103   Temp  100.2 F (37.9 C) (Oral)   Resp 18   Ht 5\' 4"  (1.626 m)   Wt 51.6 kg   SpO2 100%   BMI 19.52 kg/m   Physical Exam Vitals and nursing note reviewed.  Constitutional:      General: She is not in acute distress.    Appearance: She is well-developed. She is not diaphoretic.  HENT:     Head: Normocephalic and atraumatic.  Eyes:     Pupils: Pupils are equal, round, and reactive to light.  Cardiovascular:     Rate and Rhythm: Normal rate and regular rhythm.     Heart sounds: No murmur heard. No friction rub. No gallop.   Pulmonary:      Effort: Pulmonary effort is normal.     Breath sounds: No wheezing or rales.  Abdominal:     General: There is no distension.     Palpations: Abdomen is soft.     Tenderness: There is no abdominal tenderness.     Comments: Benign abdominal exam  Musculoskeletal:        General: No tenderness.     Cervical back: Normal range of motion and neck supple.  Skin:    General: Skin is warm and dry.  Neurological:     Mental Status: She is alert and oriented to person, place, and time.  Psychiatric:        Behavior: Behavior normal.     ED Results / Procedures / Treatments   Labs (all labs ordered are listed, but only abnormal results are displayed) Labs Reviewed  URINALYSIS, ROUTINE W REFLEX MICROSCOPIC - Abnormal; Notable for the following components:      Result Value   Hgb urine dipstick MODERATE (*)    Protein, ur 100 (*)    Nitrite POSITIVE (*)    All other components within normal limits  URINALYSIS, MICROSCOPIC (REFLEX) - Abnormal; Notable for the following components:   Bacteria, UA MANY (*)    All other components within normal limits  RESP PANEL BY RT-PCR (FLU A&B, COVID) ARPGX2    EKG None  Radiology No results found.  Procedures Procedures   Medications Ordered in ED Medications  cephALEXin (KEFLEX) capsule 1,000 mg (has no administration in time range)  ketorolac (TORADOL) injection 15 mg (15 mg Intramuscular Given 10/16/20 2023)    ED Course  I have reviewed the triage vital signs and the nursing notes.  Pertinent labs & imaging results that were available during my care of the patient were reviewed by me and considered in my medical decision making (see chart for details).    MDM Rules/Calculators/A&P                          45 yo F with a chief complaints of cough congestion fever chills myalgias nausea headache going on for couple days.  Family members had a similar illness.  On exam she is well-appearing and nontoxic.  With urinary symptoms will  obtain a UA.  Will swab for COVID.  Patient's urine is nitrite positive is seems to count bacteria.  However it has no white cells.  I am unsure what to make of that result however she is having urinary symptoms I will treat her with antibiotics.  We will have her follow-up with her family doctor.  8:42 PM:  I have discussed the diagnosis/risks/treatment options with the patient and believe the pt to be eligible for discharge home to follow-up with PCP.  We also discussed returning to the ED immediately if new or worsening sx occur. We discussed the sx which are most concerning (e.g., sudden worsening pain, sob, inability to tolerate by mouth) that necessitate immediate return. Medications administered to the patient during their visit and any new prescriptions provided to the patient are listed below.  Medications given during this visit Medications  cephALEXin (KEFLEX) capsule 1,000 mg (has no administration in time range)  ketorolac (TORADOL) injection 15 mg (15 mg Intramuscular Given 10/16/20 2023)     The patient appears reasonably screen and/or stabilized for discharge and I doubt any other medical condition or other [facility] requiring further screening, evaluation, or treatment in the ED at this time prior to discharge.   Final Clinical Impression(s) / ED Diagnoses Final diagnoses:  Viral upper respiratory tract infection  Acute cystitis without hematuria    Rx / DC Orders ED Discharge Orders         Ordered    cephALEXin (KEFLEX) 500 MG capsule  4 times daily        10/16/20 2040    ondansetron (ZOFRAN ODT) 4 MG disintegrating tablet        10/16/20 2040    benzonatate (TESSALON) 100 MG capsule  Every 8 hours        10/16/20 2040           Deno Etienne, DO 10/16/20 2042

## 2020-10-16 NOTE — ED Notes (Signed)
Urine sample taken to lab. 

## 2020-10-16 NOTE — ED Triage Notes (Signed)
Pt c/o body aches, fever x today-last dose tylenol 2 hours PTA-NAD-steady gait
